# Patient Record
Sex: Female | Born: 1990 | Race: Black or African American | Hispanic: No | Marital: Single | Smoking: Current some day smoker
Health system: Southern US, Community
[De-identification: ages and names within clinical notes are randomized; demographics above are authoritative.]

## PROBLEM LIST (undated history)

## (undated) DIAGNOSIS — E7800 Pure hypercholesterolemia, unspecified: Secondary | ICD-10-CM

## (undated) DIAGNOSIS — R519 Headache, unspecified: Secondary | ICD-10-CM

## (undated) DIAGNOSIS — R87629 Unspecified abnormal cytological findings in specimens from vagina: Secondary | ICD-10-CM

## (undated) DIAGNOSIS — D649 Anemia, unspecified: Secondary | ICD-10-CM

## (undated) DIAGNOSIS — IMO0002 Reserved for concepts with insufficient information to code with codable children: Secondary | ICD-10-CM

## (undated) DIAGNOSIS — A749 Chlamydial infection, unspecified: Secondary | ICD-10-CM

## (undated) DIAGNOSIS — R0600 Dyspnea, unspecified: Secondary | ICD-10-CM

## (undated) HISTORY — PX: NO PAST SURGERIES: SHX2092

## (undated) HISTORY — DX: Unspecified abnormal cytological findings in specimens from vagina: R87.629

## (undated) HISTORY — DX: Pure hypercholesterolemia, unspecified: E78.00

## (undated) HISTORY — DX: Headache, unspecified: R51.9

## (undated) HISTORY — DX: Dyspnea, unspecified: R06.00

## (undated) HISTORY — DX: Anemia, unspecified: D64.9

---
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
## 2010-01-02 ENCOUNTER — Emergency Department: Payer: Self-pay | Admitting: Emergency Medicine

## 2010-02-09 NOTE — L&D Delivery Note (Signed)
Pt was admitted from the office today with SROM.  Fluid was clear. She was started on PCN for GBS on admission.  She was also started on Pit Aug. Once she got to 5cm she rapidly completed the first stage.  She did have an epidural.  After the epidural was completed the baby was on the perineum.  She had a rapid SVD over intact perineum in vtx presentation.  Placenta S/I, 3 vessel cord. No tears. Delivered by midwife on call. Baby to NBN.  [id number]

## 2010-05-03 ENCOUNTER — Emergency Department: Payer: Self-pay | Admitting: Unknown Physician Specialty

## 2010-05-12 LAB — HEPATITIS B SURFACE ANTIGEN: Hepatitis B Surface Ag: NEGATIVE

## 2010-05-12 LAB — HIV ANTIBODY (ROUTINE TESTING W REFLEX): HIV: NONREACTIVE

## 2010-05-12 LAB — ABO/RH: RH Type: POSITIVE

## 2010-05-12 LAB — RPR: RPR: NONREACTIVE

## 2010-11-10 ENCOUNTER — Inpatient Hospital Stay (HOSPITAL_COMMUNITY): Payer: Medicaid Other | Admitting: Anesthesiology

## 2010-11-10 ENCOUNTER — Encounter (HOSPITAL_COMMUNITY): Payer: Self-pay

## 2010-11-10 ENCOUNTER — Encounter (HOSPITAL_COMMUNITY): Payer: Self-pay | Admitting: Anesthesiology

## 2010-11-10 ENCOUNTER — Inpatient Hospital Stay (HOSPITAL_COMMUNITY)
Admission: AD | Admit: 2010-11-10 | Discharge: 2010-11-12 | DRG: 775 | Disposition: A | Discharge status: Home / Self Care | Payer: Medicaid Other | Source: Ambulatory Visit | Attending: Obstetrics and Gynecology | Admitting: Obstetrics and Gynecology

## 2010-11-10 DIAGNOSIS — O4290 Premature rupture of membranes, unspecified as to length of time between rupture and onset of labor, unspecified weeks of gestation: Principal | ICD-10-CM | POA: Diagnosis present

## 2010-11-10 DIAGNOSIS — Z22330 Carrier of Group B streptococcus: Secondary | ICD-10-CM

## 2010-11-10 DIAGNOSIS — O99892 Other specified diseases and conditions complicating childbirth: Secondary | ICD-10-CM | POA: Diagnosis present

## 2010-11-10 HISTORY — DX: Chlamydial infection, unspecified: A74.9

## 2010-11-10 HISTORY — DX: Reserved for concepts with insufficient information to code with codable children: IMO0002

## 2010-11-10 LAB — CBC
MCH: 30.9 pg (ref 26.0–34.0)
MCHC: 34.1 g/dL (ref 30.0–36.0)
Platelets: 185 10*3/uL (ref 150–400)
RBC: 3.62 MIL/uL — ABNORMAL LOW (ref 3.87–5.11)

## 2010-11-10 MED ORDER — PENICILLIN G POTASSIUM 5000000 UNITS IJ SOLR: 5.0000 10*6.[IU] | Freq: Once | INTRAMUSCULAR | Status: DC

## 2010-11-10 MED ORDER — BUTORPHANOL TARTRATE 2 MG/ML IJ SOLN
2.0000 mg | Freq: Once | INTRAMUSCULAR | Status: DC
  Filled 2010-11-10: qty 1

## 2010-11-10 MED ORDER — OXYCODONE-ACETAMINOPHEN 5-325 MG PO TABS
1.0000 | ORAL_TABLET | ORAL | Status: DC | PRN
  Administered 2010-11-11: 1 via ORAL
  Filled 2010-11-10: qty 1

## 2010-11-10 MED ORDER — LACTATED RINGERS IV SOLN: 500.0000 mL | Freq: Once | INTRAVENOUS | Status: DC

## 2010-11-10 MED ORDER — OXYTOCIN BOLUS FROM INFUSION
500.0000 mL | Freq: Once | INTRAVENOUS | Status: AC
  Administered 2010-11-10: 500 mL via INTRAVENOUS
  Filled 2010-11-10: qty 500

## 2010-11-10 MED ORDER — EPHEDRINE 5 MG/ML INJ
10.0000 mg | INTRAVENOUS | Status: DC | PRN
  Filled 2010-11-10 (×2): qty 4

## 2010-11-10 MED ORDER — PHENYLEPHRINE 40 MCG/ML (10ML) SYRINGE FOR IV PUSH (FOR BLOOD PRESSURE SUPPORT)
80.0000 ug | PREFILLED_SYRINGE | INTRAVENOUS | Status: DC | PRN
  Filled 2010-11-10: qty 5

## 2010-11-10 MED ORDER — IBUPROFEN 600 MG PO TABS
600.0000 mg | ORAL_TABLET | Freq: Four times a day (QID) | ORAL | Status: DC
  Administered 2010-11-11 – 2010-11-12 (×6): 600 mg via ORAL
  Filled 2010-11-10 (×6): qty 1

## 2010-11-10 MED ORDER — ONDANSETRON HCL 4 MG/2ML IJ SOLN: 4.0000 mg | Freq: Four times a day (QID) | INTRAMUSCULAR | Status: DC | PRN

## 2010-11-10 MED ORDER — ONDANSETRON HCL 4 MG/2ML IJ SOLN: 4.0000 mg | INTRAMUSCULAR | Status: DC | PRN

## 2010-11-10 MED ORDER — LACTATED RINGERS IV SOLN
INTRAVENOUS | Status: DC
  Administered 2010-11-10: 12:00:00 via INTRAVENOUS

## 2010-11-10 MED ORDER — TETANUS-DIPHTH-ACELL PERTUSSIS 5-2.5-18.5 LF-MCG/0.5 IM SUSP
0.5000 mL | Freq: Once | INTRAMUSCULAR | Status: AC
  Administered 2010-11-11: 0.5 mL via INTRAMUSCULAR
  Filled 2010-11-10: qty 0.5

## 2010-11-10 MED ORDER — LACTATED RINGERS IV SOLN
500.0000 mL | INTRAVENOUS | Status: DC | PRN
Start: 2010-11-10 — End: 2010-11-10
  Administered 2010-11-10: 500 mL via INTRAVENOUS

## 2010-11-10 MED ORDER — DIPHENHYDRAMINE HCL 50 MG/ML IJ SOLN: 12.5000 mg | INTRAMUSCULAR | Status: DC | PRN

## 2010-11-10 MED ORDER — SIMETHICONE 80 MG PO CHEW: 80.0000 mg | CHEWABLE_TABLET | ORAL | Status: DC | PRN

## 2010-11-10 MED ORDER — OXYTOCIN 20 UNITS IN LACTATED RINGERS INFUSION - SIMPLE: 125.0000 mL/h | Freq: Once | INTRAVENOUS | Status: DC

## 2010-11-10 MED ORDER — FENTANYL 2.5 MCG/ML BUPIVACAINE 1/10 % EPIDURAL INFUSION (WH - ANES)
14.0000 mL/h | INTRAMUSCULAR | Status: DC
  Administered 2010-11-10: 14 mL/h via EPIDURAL
  Filled 2010-11-10: qty 60

## 2010-11-10 MED ORDER — ZOLPIDEM TARTRATE 5 MG PO TABS: 5.0000 mg | ORAL_TABLET | Freq: Every evening | ORAL | Status: DC | PRN

## 2010-11-10 MED ORDER — WITCH HAZEL-GLYCERIN EX PADS: 1.0000 "application " | MEDICATED_PAD | CUTANEOUS | Status: DC | PRN

## 2010-11-10 MED ORDER — OXYCODONE-ACETAMINOPHEN 5-325 MG PO TABS: 2.0000 | ORAL_TABLET | ORAL | Status: DC | PRN

## 2010-11-10 MED ORDER — LIDOCAINE HCL (PF) 1 % IJ SOLN
30.0000 mL | INTRAMUSCULAR | Status: DC | PRN
  Filled 2010-11-10 (×2): qty 30

## 2010-11-10 MED ORDER — IBUPROFEN 600 MG PO TABS
600.0000 mg | ORAL_TABLET | Freq: Four times a day (QID) | ORAL | Status: DC | PRN
  Administered 2010-11-10: 600 mg via ORAL
  Filled 2010-11-10: qty 1

## 2010-11-10 MED ORDER — MEASLES, MUMPS & RUBELLA VAC ~~LOC~~ INJ
0.5000 mL | INJECTION | Freq: Once | SUBCUTANEOUS | Status: DC
  Filled 2010-11-10: qty 0.5

## 2010-11-10 MED ORDER — PHENYLEPHRINE 40 MCG/ML (10ML) SYRINGE FOR IV PUSH (FOR BLOOD PRESSURE SUPPORT)
80.0000 ug | PREFILLED_SYRINGE | INTRAVENOUS | Status: DC | PRN
  Filled 2010-11-10 (×2): qty 5

## 2010-11-10 MED ORDER — FLEET ENEMA 7-19 GM/118ML RE ENEM: 1.0000 | ENEMA | RECTAL | Status: DC | PRN

## 2010-11-10 MED ORDER — OXYTOCIN 20 UNITS IN LACTATED RINGERS INFUSION - SIMPLE
1.0000 m[IU]/min | INTRAVENOUS | Status: DC
  Administered 2010-11-10: 2 m[IU]/min via INTRAVENOUS
  Filled 2010-11-10: qty 1000

## 2010-11-10 MED ORDER — PENICILLIN G POTASSIUM 5000000 UNITS IJ SOLR: 2.5000 10*6.[IU] | INTRAVENOUS | Status: DC

## 2010-11-10 MED ORDER — PENICILLIN G POTASSIUM 5000000 UNITS IJ SOLR
2.5000 10*6.[IU] | INTRAVENOUS | Status: DC
  Administered 2010-11-10: 2.5 10*6.[IU] via INTRAVENOUS
  Filled 2010-11-10 (×4): qty 2.5

## 2010-11-10 MED ORDER — CITRIC ACID-SODIUM CITRATE 334-500 MG/5ML PO SOLN: 30.0000 mL | ORAL | Status: DC | PRN

## 2010-11-10 MED ORDER — ONDANSETRON HCL 4 MG PO TABS: 4.0000 mg | ORAL_TABLET | ORAL | Status: DC | PRN

## 2010-11-10 MED ORDER — BENZOCAINE-MENTHOL 20-0.5 % EX AERO: 1.0000 "application " | INHALATION_SPRAY | CUTANEOUS | Status: DC | PRN

## 2010-11-10 MED ORDER — ACETAMINOPHEN 325 MG PO TABS: 650.0000 mg | ORAL_TABLET | ORAL | Status: DC | PRN

## 2010-11-10 MED ORDER — EPHEDRINE 5 MG/ML INJ
10.0000 mg | INTRAVENOUS | Status: DC | PRN
  Filled 2010-11-10: qty 4

## 2010-11-10 MED ORDER — PENICILLIN G POTASSIUM 5000000 UNITS IJ SOLR
5.0000 10*6.[IU] | Freq: Once | INTRAMUSCULAR | Status: AC
  Administered 2010-11-10: 5 10*6.[IU] via INTRAVENOUS
  Filled 2010-11-10: qty 5

## 2010-11-10 MED ORDER — LIDOCAINE HCL 1.5 % IJ SOLN
INTRAMUSCULAR | Status: DC | PRN
  Administered 2010-11-10 (×2): 5 mL via INTRADERMAL

## 2010-11-10 MED ORDER — TERBUTALINE SULFATE 1 MG/ML IJ SOLN: 0.2500 mg | Freq: Once | INTRAMUSCULAR | Status: DC | PRN

## 2010-11-10 MED ORDER — DIBUCAINE 1 % RE OINT: 1.0000 "application " | TOPICAL_OINTMENT | RECTAL | Status: DC | PRN

## 2010-11-10 NOTE — H&P (Signed)
Pt is a 19 y/o female, G2P0 who was seen in the office today c/o ROM.  In the office she had positive pool.  She was admitted for induction.  PNC was c/o by + GBS. PMHx: see [facility] PE: VSSAF  HEENT- wnl  ABD: -  Gravid, nontender, no ctxs  FHTs- reactive. IMP/ IUP with PROM. PLAN/ Admit.

## 2010-11-10 NOTE — Progress Notes (Signed)
[facility] called for delivery. Unable to reach provider at this time.

## 2010-11-10 NOTE — Progress Notes (Signed)
[facility] called for delivery. Provider states that he is 10 minutes away.

## 2010-11-10 NOTE — Anesthesia Preprocedure Evaluation (Signed)
Anesthesia Evaluation  Name, MR# and DOB Patient awake  General Assessment Comment  Reviewed: Allergy & Precautions, H&P , Patient's Chart, lab work & pertinent test results  Airway Mallampati: III TM Distance: >3 FB Neck ROM: full    Dental No notable dental hx.    Pulmonary asthma  clear to auscultation  Pulmonary exam normal       Cardiovascular regular Normal    Neuro/Psych Negative Neurological ROS  Negative Psych ROS   GI/Hepatic negative GI ROS Neg liver ROS    Endo/Other  Negative Endocrine ROS  Renal/GU negative Renal ROS     Musculoskeletal   Abdominal   Peds  Hematology negative hematology ROS (+)   Anesthesia Other Findings   Reproductive/Obstetrics (+) Pregnancy                           Anesthesia Physical Anesthesia Plan  ASA: II  Anesthesia Plan: Epidural   Post-op Pain Management:    Induction:   Airway Management Planned:   Additional Equipment:   Intra-op Plan:   Post-operative Plan:   Informed Consent: I have reviewed the patients History and Physical, chart, labs and discussed the procedure including the risks, benefits and alternatives for the proposed anesthesia with the patient or authorized representative who has indicated his/her understanding and acceptance.     Plan Discussed with:   Anesthesia Plan Comments:         Anesthesia Quick Evaluation

## 2010-11-10 NOTE — Anesthesia Procedure Notes (Signed)
Epidural Patient location during procedure: OB Start time: 11/10/2010 8:36 PM  Staffing Performed by: anesthesiologist   Preanesthetic Checklist Completed: patient identified, site marked, surgical consent, pre-op evaluation, timeout performed, IV checked, risks and benefits discussed and monitors and equipment checked  Epidural Patient position: sitting Prep: site prepped and draped and DuraPrep Patient monitoring: continuous pulse ox and blood pressure Approach: midline Injection technique: LOR air and LOR saline  Needle:  Needle type: Tuohy  Needle gauge: 17 G Needle length: 9 cm Needle insertion depth: 6 cm Catheter type: closed end flexible Catheter size: 19 Gauge Catheter at skin depth: 11 cm Test dose: negative  Assessment Events: blood not aspirated, injection not painful, no injection resistance, negative IV test and no paresthesia  Additional Notes Patient identified.  Risk benefits discussed including failed block, incomplete pain control, headache, nerve damage, paralysis, blood pressure changes, nausea, vomiting, reactions to medication both toxic or allergic, and postpartum back pain.  Patient expressed understanding and wished to proceed.  All questions were answered.  Sterile technique used throughout procedure and epidural site dressed with sterile barrier dressing. No paresthesia or other complications noted.The patient did not experience any signs of intravascular injection such as tinnitus or metallic taste in mouth nor signs of intrathecal spread such as rapid motor block. Please see nursing notes for vital signs.

## 2010-11-10 NOTE — Progress Notes (Signed)
Shores, CNM, called for backup for delivery.

## 2010-11-10 NOTE — Progress Notes (Signed)
Updated on patient status. Patient requesting IV pain medication. Orders received for 2 mg of Stadol IV

## 2010-11-11 LAB — CBC
HCT: 33.1 % — ABNORMAL LOW (ref 36.0–46.0)
Hemoglobin: 11.1 g/dL — ABNORMAL LOW (ref 12.0–15.0)
MCHC: 33.5 g/dL (ref 30.0–36.0)
WBC: 11.4 10*3/uL — ABNORMAL HIGH (ref 4.0–10.5)

## 2010-11-11 LAB — RPR: RPR Ser Ql: NONREACTIVE

## 2010-11-11 MED ORDER — BENZOCAINE-MENTHOL 20-0.5 % EX AERO
INHALATION_SPRAY | CUTANEOUS | Status: AC
  Filled 2010-11-11: qty 56

## 2010-11-11 NOTE — Progress Notes (Signed)
Post Partum Day 1 Subjective: no complaints  Objective: Blood pressure 126/71, pulse 100, temperature 98 F (36.7 C), temperature source Oral, resp. rate 18, height 5\' 5"  (1.651 m), weight 95.709 kg (211 lb), last menstrual period 02/19/2010, SpO2 98.00%, unknown if currently breastfeeding.  Physical Exam:  General: alert Lochia: appropriate Uterine Fundus: firm   Basename 11/11/10 0525 11/10/10 1300  HGB 11.1* 11.2*  HCT 33.1* 32.8*    Assessment/Plan: Plan for discharge tomorrow, wants baby circumcised but has not yet paid.  Discussed option to perform circ in office.   LOS: 1 day   Donevin Sainsbury D 11/11/2010, 8:09 AM

## 2010-11-11 NOTE — Progress Notes (Signed)
UR chart review completed.  

## 2010-11-12 MED ORDER — INFLUENZA VIRUS VACC SPLIT PF IM SUSP
0.5000 mL | INTRAMUSCULAR | Status: AC | PRN
  Administered 2010-11-12: 0.5 mL via INTRAMUSCULAR
  Filled 2010-11-12: qty 0.5

## 2010-11-12 NOTE — Anesthesia Postprocedure Evaluation (Signed)
  Anesthesia Post-op Note  Patient: Jasmine Bowman  Procedure(s) Performed: * No procedures listed *  Patient Location: Mother/Baby  Anesthesia Type: Epidural  Level of Consciousness: awake, alert , oriented and patient cooperative  Airway and Oxygen Therapy: Patient Spontanous Breathing  Post-op Pain: none  Post-op Assessment: Patient's Cardiovascular Status Stable, Respiratory Function Stable, Patent Airway, No signs of Nausea or vomiting, Adequate PO intake and Pain level controlled  Post-op Vital Signs: Reviewed and stable  Complications: No apparent anesthesia complications

## 2010-11-12 NOTE — Anesthesia Postprocedure Evaluation (Signed)
Anesthesia Post Note  Patient: Jasmine Bowman  Procedure(s) Performed: * No procedures listed *  Anesthesia type: Epidural  Patient location: Mother/Baby  Post pain: Pain level controlled  Post assessment: Post-op Vital signs reviewed  Last Vitals:  Filed Vitals:   11/11/10 2140  BP: 134/65  Pulse: 98  Temp: 97.7 F (36.5 C)  Resp: 18    Post vital signs: Reviewed  Level of consciousness: awake  Complications: No apparent anesthesia complications

## 2010-11-12 NOTE — Addendum Note (Signed)
Addendum  created 11/12/10 1047 by Suella Grove   Modules edited:Charges VN, Notes Section

## 2010-11-12 NOTE — Progress Notes (Signed)
Patient is eating, ambulating, voiding.  Pain control is good.  Filed Vitals:   11/11/10 0430 11/11/10 1455 11/11/10 2140 11/12/10 0629  BP: 126/71 118/71 134/65 114/74  Pulse: 100 97 98 92  Temp: 98 F (36.7 C) 98.1 F (36.7 C) 97.7 F (36.5 C) 98 F (36.7 C)  TempSrc: Oral Oral Oral Oral  Resp: 18 18 18 18   Height:      Weight:      SpO2:        Fundus firm Perineum without swelling.  Lab Results  Component Value Date   WBC 11.4* 11/11/2010   HGB 11.1* 11/11/2010   HCT 33.1* 11/11/2010   MCV 90.7 11/11/2010   PLT 189 11/11/2010    A/Positive/-- (04/02 0000)/RI  A/P Post partum day 2.  Routine care.  Expect d/c per plan.    Yosselin Zoeller A

## 2010-11-12 NOTE — Discharge Summary (Signed)
Obstetric Discharge Summary Reason for Admission: SROM, induction Prenatal Procedures: none Intrapartum Procedures: spontaneous vaginal delivery Postpartum Procedures: none Complications-Operative and Postpartum: none Hemoglobin  Date Value Range Status  11/11/2010 11.1* 12.0-15.0 (g/dL) Final     HCT  Date Value Range Status  11/11/2010 33.1* 36.0-46.0 (%) Final    Discharge Diagnoses: Term Pregnancy-delivered  Discharge Information: Date: 11/12/2010 Activity: pelvic rest Diet: routine Medications: Ibuprofen Condition: stable Instructions: refer to practice specific booklet Discharge to: home Follow-up Information    Make an appointment with Mickel Baas.   Contact information:   [address] [place] [address] [phone]          Newborn Data: Live born female  Birth Weight: 6 lb 2.2 oz (2785 g) APGAR: 9, 9  Home with mother.  Griselle Rufer A 11/12/2010, 7:12 AM

## 2012-06-06 IMAGING — US US OB < 14 WEEKS
1 series · 17 of 28 positions shown · non-contrast
Comparison: none

REASON FOR EXAM: vaginal bleeding    Flex 6
COMMENTS:

[Series 1: us ob < 14 weeks · 17 of 35 slices shown]
[im 1/35]
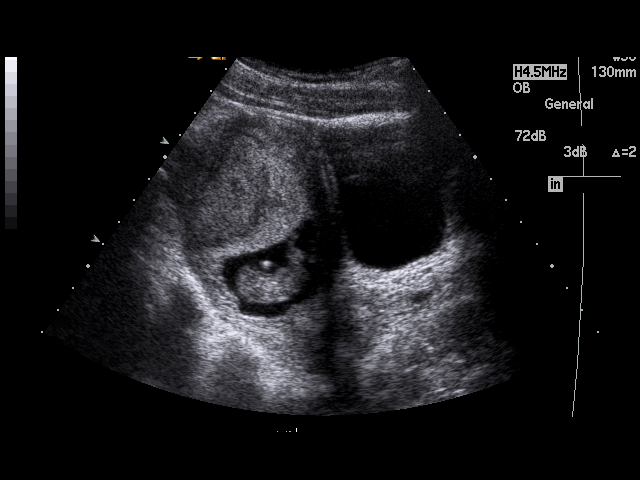
[im 3/35]
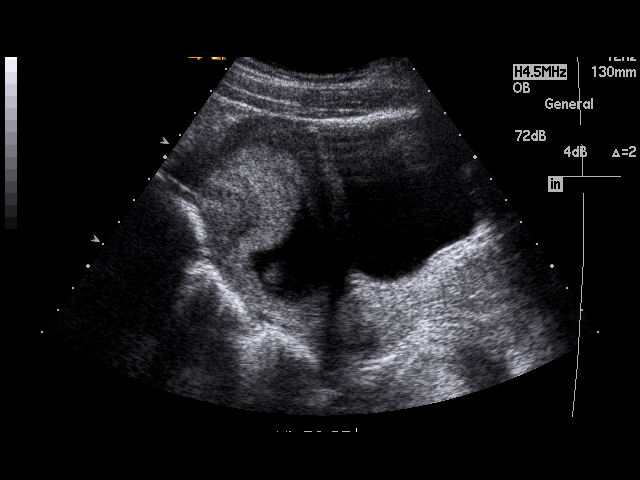
[im 6/35]
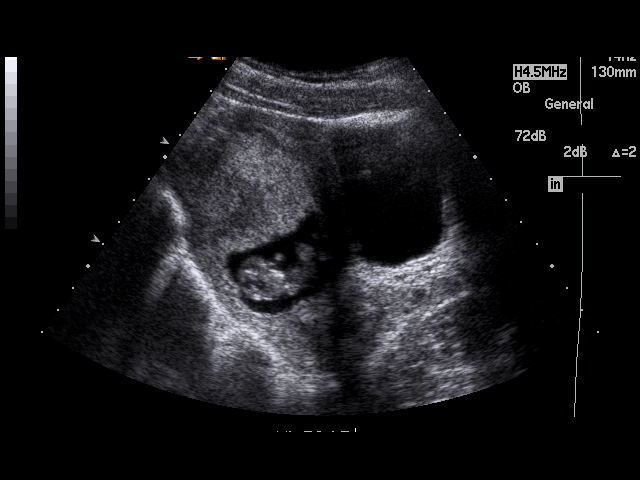
[im 7/35]
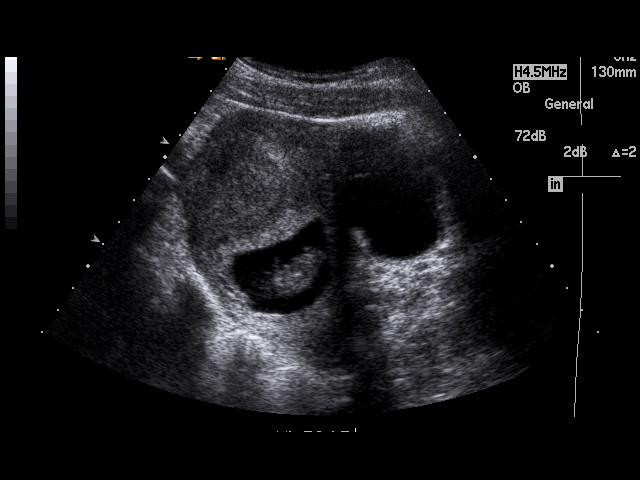
[im 9/35]
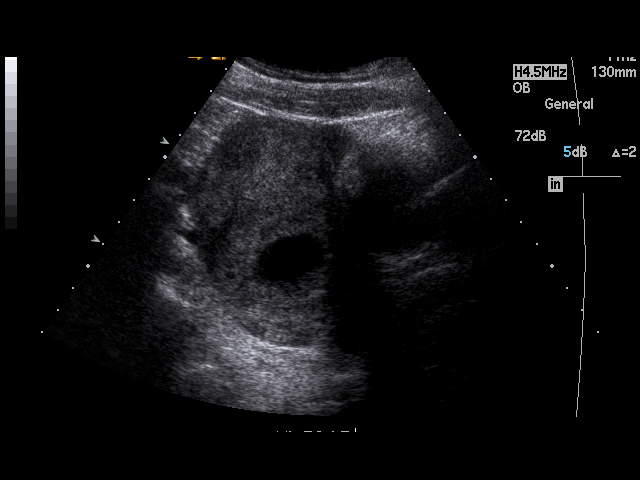
[im 12/35]
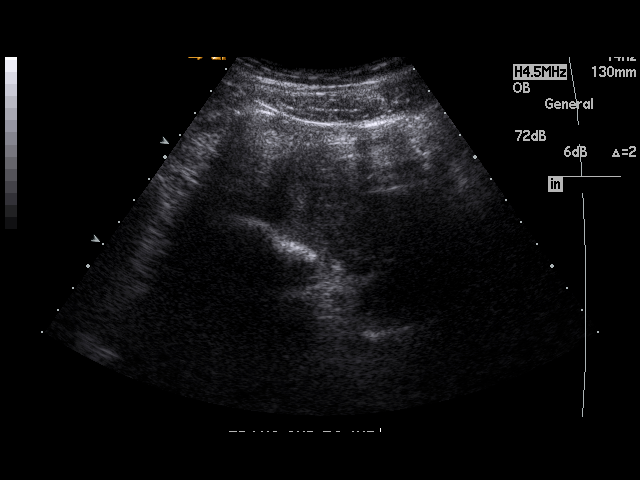
[im 13/35]
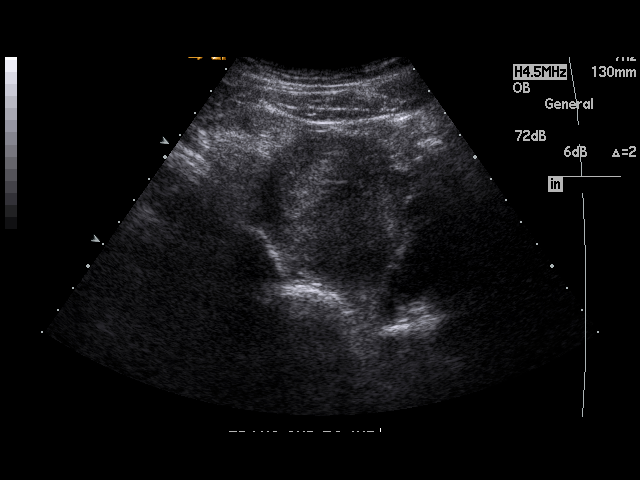
[im 16/35]
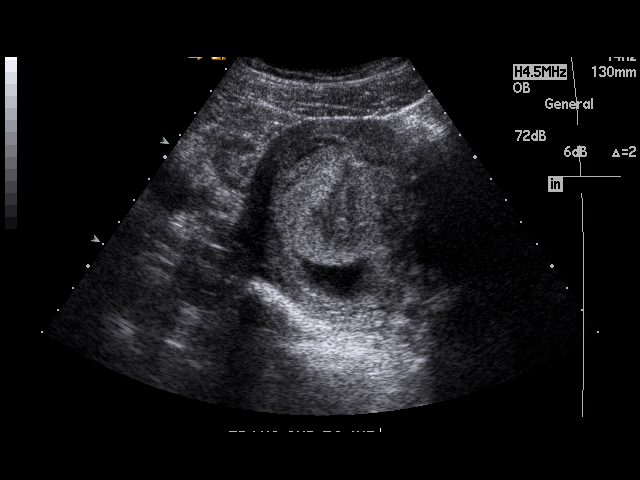
[im 18/35]
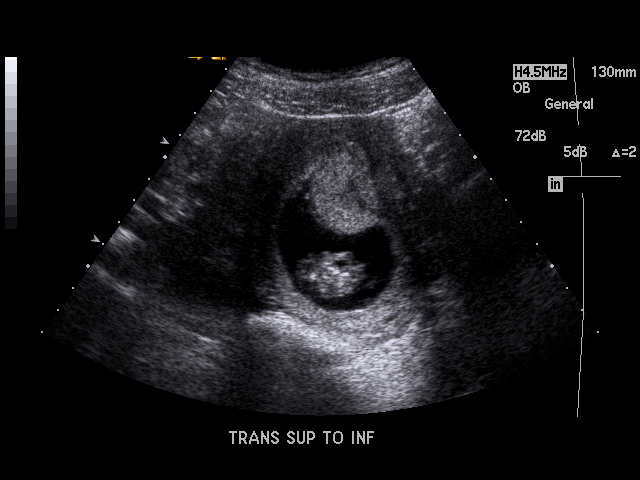
[im 19/35]
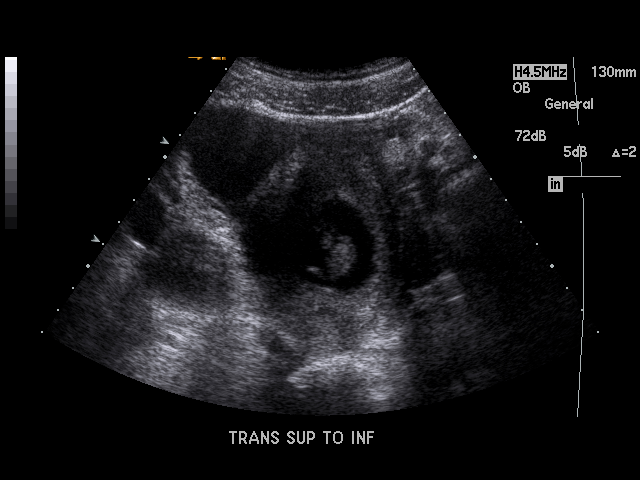
[im 22/35]
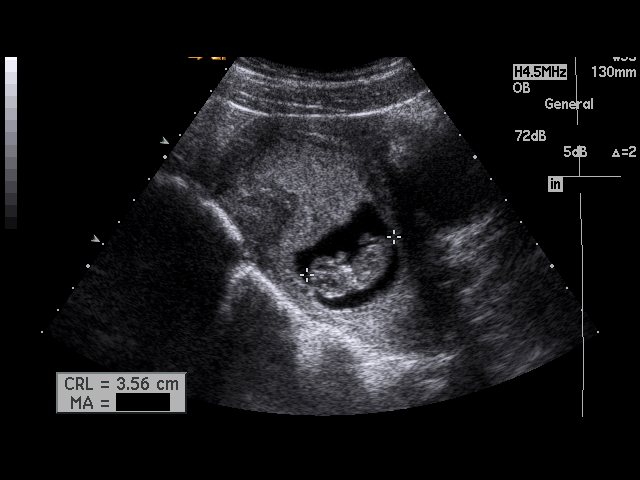
[im 23/35]
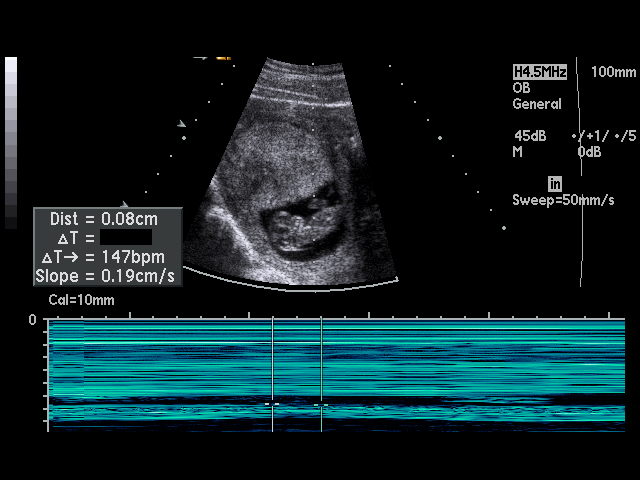
[im 26/35]
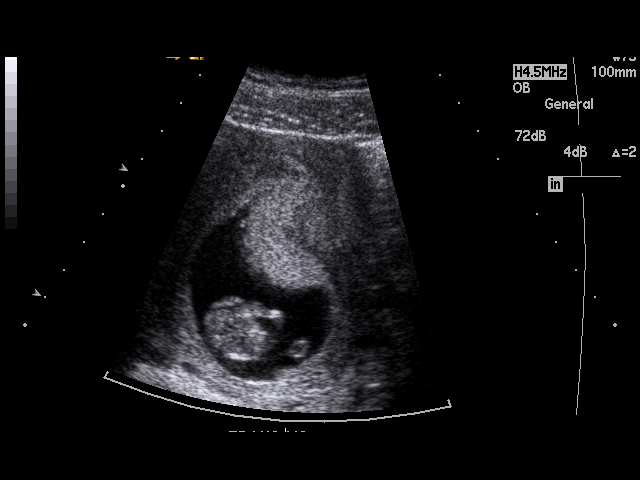
[im 28/35]
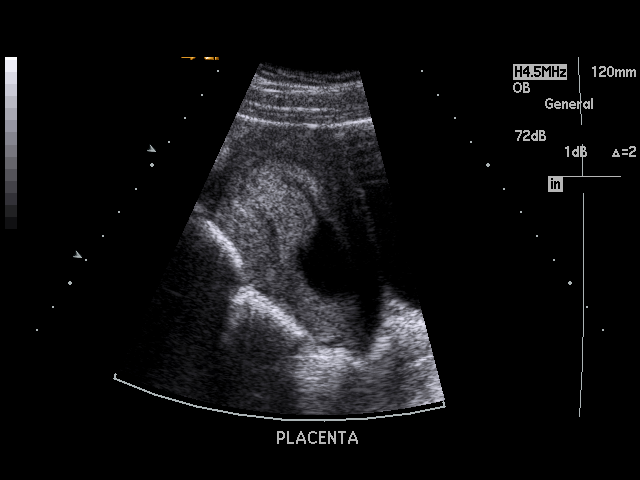
[im 29/35]
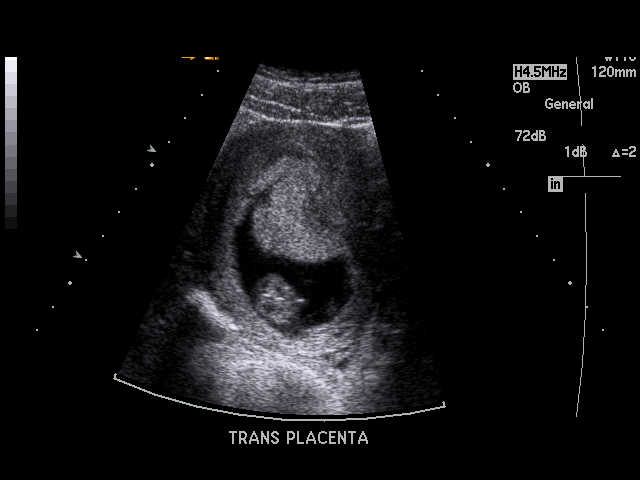
[im 32/35]
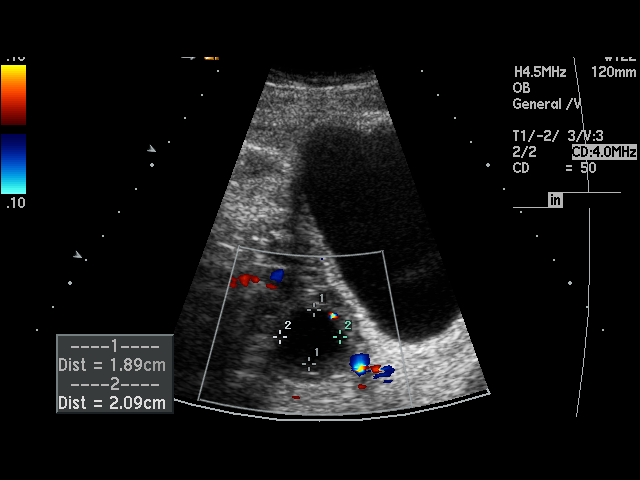
[im 35/35]
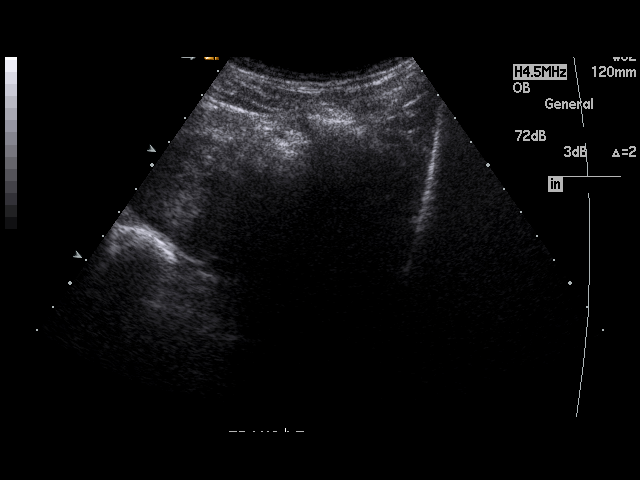

[17 of 28 positions shown; findings below may reference images not displayed]

PROCEDURE:     US  - US OB LESS THAN 14 WEEKS  - May 03, 2010 [DATE]

RESULT:     Comparison: 01/03/2010

Technique and Findings:
Multiple grayscale and color Doppler images were obtained of the pelvis by
transabdominal ultrasound.

There is a fluid collection within the endometrial canal, consistent with a
gestational sac. There is a single live intrauterine pregnancy with a
gestational sac. The crown-rump length correlates with estimated gestational
age of 10 weeks 3 days. A yolk sac is present. The fetal heart rate was
measured at 150 beats per minute.

The right ovary measures 3.6 x 2.9 x 3.4 cm. There is an anechoic cyst in
the right ovary which measures 2.3 cm in greatest diameter. The left ovary
was not visualized.
IMPRESSION: Single live intrauterine pregnancy, with estimated gestational age of 10
weeks 3 days.

## 2013-12-11 ENCOUNTER — Encounter (HOSPITAL_COMMUNITY): Payer: Self-pay

## 2014-10-18 ENCOUNTER — Emergency Department (HOSPITAL_COMMUNITY)
Admission: EM | Admit: 2014-10-18 | Discharge: 2014-10-18 | Disposition: A | Discharge status: Home / Self Care | Payer: No Typology Code available for payment source | Attending: Emergency Medicine | Admitting: Emergency Medicine

## 2014-10-18 ENCOUNTER — Encounter (HOSPITAL_COMMUNITY): Payer: Self-pay | Admitting: *Deleted

## 2014-10-18 ENCOUNTER — Emergency Department (HOSPITAL_COMMUNITY): Payer: No Typology Code available for payment source

## 2014-10-18 DIAGNOSIS — S233XXA Sprain of ligaments of thoracic spine, initial encounter: Secondary | ICD-10-CM | POA: Insufficient documentation

## 2014-10-18 DIAGNOSIS — Z8619 Personal history of other infectious and parasitic diseases: Secondary | ICD-10-CM | POA: Diagnosis not present

## 2014-10-18 DIAGNOSIS — S29012A Strain of muscle and tendon of back wall of thorax, initial encounter: Secondary | ICD-10-CM | POA: Insufficient documentation

## 2014-10-18 DIAGNOSIS — Y92410 Unspecified street and highway as the place of occurrence of the external cause: Secondary | ICD-10-CM | POA: Diagnosis not present

## 2014-10-18 DIAGNOSIS — J45909 Unspecified asthma, uncomplicated: Secondary | ICD-10-CM | POA: Diagnosis not present

## 2014-10-18 DIAGNOSIS — Y9389 Activity, other specified: Secondary | ICD-10-CM | POA: Insufficient documentation

## 2014-10-18 DIAGNOSIS — S161XXA Strain of muscle, fascia and tendon at neck level, initial encounter: Secondary | ICD-10-CM | POA: Diagnosis not present

## 2014-10-18 DIAGNOSIS — Y998 Other external cause status: Secondary | ICD-10-CM | POA: Diagnosis not present

## 2014-10-18 DIAGNOSIS — S199XXA Unspecified injury of neck, initial encounter: Secondary | ICD-10-CM | POA: Diagnosis present

## 2014-10-18 MED ORDER — CYCLOBENZAPRINE HCL 10 MG PO TABS
5.0000 mg | ORAL_TABLET | Freq: Once | ORAL | Status: AC
  Administered 2014-10-18: 5 mg via ORAL
  Filled 2014-10-18: qty 1

## 2014-10-18 MED ORDER — CYCLOBENZAPRINE HCL 5 MG PO TABS: 5.0000 mg | ORAL_TABLET | Freq: Three times a day (TID) | ORAL | Status: DC

## 2014-10-18 MED ORDER — IBUPROFEN 600 MG PO TABS: 600.0000 mg | ORAL_TABLET | Freq: Once | ORAL | Status: DC

## 2014-10-18 MED ORDER — IBUPROFEN 200 MG PO TABS
600.0000 mg | ORAL_TABLET | Freq: Once | ORAL | Status: AC
  Administered 2014-10-18: 600 mg via ORAL
  Filled 2014-10-18: qty 3

## 2014-10-18 NOTE — Discharge Instructions (Signed)
Our x-rays are normal.  U been given an anti-inflammatory as well as a muscle relaxer.  Please take these on a regular basis.  He been given some exercises that you can do for your neck that will help as well
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
## 2014-10-30 ENCOUNTER — Emergency Department (HOSPITAL_COMMUNITY)
Admission: EM | Admit: 2014-10-30 | Discharge: 2014-10-30 | Disposition: A | Discharge status: Home / Self Care | Payer: No Typology Code available for payment source | Attending: Emergency Medicine | Admitting: Emergency Medicine

## 2014-10-30 ENCOUNTER — Encounter (HOSPITAL_COMMUNITY): Payer: Self-pay | Admitting: Emergency Medicine

## 2014-10-30 DIAGNOSIS — R51 Headache: Secondary | ICD-10-CM | POA: Diagnosis present

## 2014-10-30 DIAGNOSIS — G44209 Tension-type headache, unspecified, not intractable: Secondary | ICD-10-CM | POA: Diagnosis not present

## 2014-10-30 DIAGNOSIS — J45909 Unspecified asthma, uncomplicated: Secondary | ICD-10-CM | POA: Insufficient documentation

## 2014-10-30 DIAGNOSIS — Z8619 Personal history of other infectious and parasitic diseases: Secondary | ICD-10-CM | POA: Insufficient documentation

## 2014-10-30 DIAGNOSIS — Z79899 Other long term (current) drug therapy: Secondary | ICD-10-CM | POA: Diagnosis not present

## 2014-10-30 DIAGNOSIS — M542 Cervicalgia: Secondary | ICD-10-CM | POA: Diagnosis not present

## 2014-10-30 DIAGNOSIS — M545 Low back pain: Secondary | ICD-10-CM | POA: Diagnosis not present

## 2014-10-30 DIAGNOSIS — Z87828 Personal history of other (healed) physical injury and trauma: Secondary | ICD-10-CM | POA: Insufficient documentation

## 2014-10-30 NOTE — Discharge Instructions (Signed)
Tension Headache °A tension headache is pain, pressure, or aching felt over the front and sides of the head. Tension headaches often come after stress, feeling worried (anxiety), or feeling sad or down for a while (depressed). °HOME CARE °· Only take medicine as told by your doctor. °· Lie down in a dark, quiet room when you have a headache. °· Keep a journal to find out if certain things bring on headaches. For example, write down: °¨ What you eat and drink. °¨ How much sleep you get. °¨ Any change to your diet or medicines. °· Relax by getting a massage or doing other relaxing activities. °· Put ice or heat packs on the head and neck area as told by your doctor. °· Lessen stress. °· Sit up straight. Do not tighten (tense) your muscles. °· Quit smoking if you smoke. °· Lessen how much alcohol you drink. °· Lessen how much caffeine you drink, or stop drinking caffeine. °· Eat and exercise regularly. °· Get enough sleep. °· Avoid using too much pain medicine. °GET HELP RIGHT AWAY IF:  °· Your headache becomes really bad. °· You have a fever. °· You have a stiff neck. °· You have trouble seeing. °· Your muscles are weak, or you lose muscle control. °· You lose your balance or have trouble walking. °· You feel like you will pass out (faint), or you pass out. °· You have really bad symptoms that are different than your first symptoms. °· You have problems with the medicines given to you by your doctor. °· Your medicines do not work. °· Your headache feels different than the other headaches. °· You feel sick to your stomach (nauseous) or throw up (vomit). °MAKE SURE YOU:  °· Understand these instructions. °· Will watch your condition. °· Will get help right away if you are not doing well or get worse. °Document Released: 04/22/2009 Document Revised: 04/20/2011 Document Reviewed: 01/16/2011 °[employer]® Patient Information ©2015 [employer]. This information is not intended to replace advice given to you by your health  care provider. Make sure you discuss any questions you have with your health care provider. ° °
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
## 2014-10-30 NOTE — ED Notes (Signed)
NP at bedside.
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
## 2014-11-15 ENCOUNTER — Ambulatory Visit (INDEPENDENT_AMBULATORY_CARE_PROVIDER_SITE_OTHER): Payer: Commercial Managed Care - HMO | Admitting: Neurology

## 2014-11-15 ENCOUNTER — Encounter: Payer: Self-pay | Admitting: Neurology

## 2014-11-15 VITALS — BP 106/73 | HR 78 | Ht 65.5 in | Wt 221.0 lb

## 2014-11-15 DIAGNOSIS — F0781 Postconcussional syndrome: Secondary | ICD-10-CM

## 2014-11-15 DIAGNOSIS — S0990XA Unspecified injury of head, initial encounter: Secondary | ICD-10-CM | POA: Diagnosis not present

## 2014-11-15 MED ORDER — NORTRIPTYLINE HCL 10 MG PO CAPS: 10.0000 mg | ORAL_CAPSULE | Freq: Every day | ORAL | Status: AC

## 2014-11-15 MED ORDER — CYCLOBENZAPRINE HCL 5 MG PO TABS: 5.0000 mg | ORAL_TABLET | Freq: Three times a day (TID) | ORAL | Status: AC | PRN

## 2014-11-15 NOTE — Patient Instructions (Signed)
Overall you are doing fairly well but I do want to suggest a few things today:   Remember to drink plenty of fluid, eat healthy meals and do not skip any meals. Try to eat protein with a every meal and eat a healthy snack such as fruit or nuts in between meals. Try to keep a regular sleep-wake schedule and try to exercise daily, particularly in the form of walking, 20-30 minutes a day, if you can.   As far as your medications are concerned, I would like to suggest: Nortriptyline at night an hour before bed Flexeril as needed up to 3x a day, don't take with soma may cause sedation  As far as diagnostic testing: CT of the head, labs  I would like to see you back as needed, sooner if we need to. Please call us with any interim questions, concerns, problems, updates or refill requests.   Our phone number is [phone]. We also have an after hours call service for urgent matters and there is a physician on-call for urgent questions. For any emergencies you know to call 911 or go to the nearest emergency room
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
## 2014-11-16 ENCOUNTER — Telehealth: Payer: Self-pay | Admitting: *Deleted

## 2014-11-16 LAB — COMPREHENSIVE METABOLIC PANEL
ALT: 29 IU/L (ref 0–32)
AST: 24 IU/L (ref 0–40)
Albumin/Globulin Ratio: 1.5 (ref 1.1–2.5)
Albumin: 4.3 g/dL (ref 3.5–5.5)
Alkaline Phosphatase: 58 IU/L (ref 39–117)
BUN/Creatinine Ratio: 15 (ref 8–20)
BUN: 12 mg/dL (ref 6–20)
Bilirubin Total: 0.2 mg/dL (ref 0.0–1.2)
CALCIUM: 9.5 mg/dL (ref 8.7–10.2)
CO2: 24 mmol/L (ref 18–29)
CREATININE: 0.8 mg/dL (ref 0.57–1.00)
Chloride: 99 mmol/L (ref 97–108)
GFR, EST AFRICAN AMERICAN: 119 mL/min/{1.73_m2} (ref >59)
GFR, EST NON AFRICAN AMERICAN: 103 mL/min/{1.73_m2} (ref >59)
GLOBULIN, TOTAL: 2.9 g/dL (ref 1.5–4.5)
Glucose: 91 mg/dL (ref 65–99)
Potassium: 4.3 mmol/L (ref 3.5–5.2)
SODIUM: 138 mmol/L (ref 134–144)
TOTAL PROTEIN: 7.2 g/dL (ref 6.0–8.5)

## 2014-11-16 LAB — CBC
HEMATOCRIT: 37.2 % (ref 34.0–46.6)
HEMOGLOBIN: 12.3 g/dL (ref 11.1–15.9)
MCH: 28.5 pg (ref 26.6–33.0)
MCHC: 33.1 g/dL (ref 31.5–35.7)
MCV: 86 fL (ref 79–97)
Platelets: 269 10*3/uL (ref 150–379)
RBC: 4.31 x10E6/uL (ref 3.77–5.28)
RDW: 13.5 % (ref 12.3–15.4)
WBC: 4.8 10*3/uL (ref 3.4–10.8)

## 2014-11-16 NOTE — Telephone Encounter (Signed)
-----   Message from Anson Fret, MD sent at 11/16/2014  9:51 AM EDT ----- Let patient know labs normal

## 2014-11-16 NOTE — Telephone Encounter (Signed)
LVM for pt to call back about lab results. Gave [facility] phone number and hours. Okay to inform her labs were normal.

## 2014-11-22 NOTE — Telephone Encounter (Signed)
LVM for pt to call back about lab results. Gave [facility] phone number and office hours. Okay to inform her labs are normal.

## 2014-11-22 NOTE — Telephone Encounter (Signed)
I advised the patient her lab results were normal.

## 2014-11-23 ENCOUNTER — Ambulatory Visit
Admission: RE | Admit: 2014-11-23 | Discharge: 2014-11-23 | Disposition: A | Discharge status: Home / Self Care | Payer: Medicaid Other | Source: Ambulatory Visit | Attending: Neurology | Admitting: Neurology

## 2014-11-23 DIAGNOSIS — F0781 Postconcussional syndrome: Secondary | ICD-10-CM

## 2014-11-23 DIAGNOSIS — S0990XA Unspecified injury of head, initial encounter: Secondary | ICD-10-CM

## 2014-11-26 ENCOUNTER — Telehealth: Payer: Self-pay | Admitting: *Deleted

## 2014-11-26 NOTE — Telephone Encounter (Signed)
-----   Message from Anson FretAntonia B Ahern, MD sent at 11/25/2014  9:25 PM EDT ----- Let patient know the CT of her head is normal thanks

## 2014-11-26 NOTE — Telephone Encounter (Signed)
LVM for pt to call about results. Gave [facility] phone number and office hours. Okay to let her know CT head was normal.

## 2014-11-29 NOTE — Telephone Encounter (Signed)
Spoke to pt and gave her normal CT head results.   She verbalized understanding.
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
## 2017-03-29 MED FILL — BENZONATATE 100 MG CAP: 100 | 10 days supply | Qty: 30 | Fill #0

## 2017-03-29 MED FILL — AMOXICILLIN 500 MG CAPSULE: 500 | 10 days supply | Qty: 30 | Fill #0

## 2017-04-06 MED FILL — FLUCONAZOLE 150 MG TABLET: 150 | 2 days supply | Qty: 2 | Fill #0

## 2018-12-21 ENCOUNTER — Other Ambulatory Visit: Payer: Self-pay

## 2018-12-21 ENCOUNTER — Ambulatory Visit (INDEPENDENT_AMBULATORY_CARE_PROVIDER_SITE_OTHER): Payer: Medicaid Other | Admitting: Certified Nurse Midwife

## 2018-12-21 ENCOUNTER — Other Ambulatory Visit (HOSPITAL_COMMUNITY)
Admission: RE | Admit: 2018-12-21 | Discharge: 2018-12-21 | Disposition: A | Discharge status: Home / Self Care | Payer: Medicaid Other | Source: Ambulatory Visit | Attending: Certified Nurse Midwife | Admitting: Certified Nurse Midwife

## 2018-12-21 ENCOUNTER — Encounter: Payer: Self-pay | Admitting: Certified Nurse Midwife

## 2018-12-21 VITALS — BP 120/80 | HR 77 | Ht 66.0 in | Wt 198.3 lb

## 2018-12-21 DIAGNOSIS — Z01419 Encounter for gynecological examination (general) (routine) without abnormal findings: Secondary | ICD-10-CM | POA: Diagnosis not present

## 2018-12-21 DIAGNOSIS — Z0000 Encounter for general adult medical examination without abnormal findings: Secondary | ICD-10-CM

## 2018-12-21 DIAGNOSIS — Z113 Encounter for screening for infections with a predominantly sexual mode of transmission: Secondary | ICD-10-CM | POA: Diagnosis not present

## 2018-12-21 DIAGNOSIS — Z30011 Encounter for initial prescription of contraceptive pills: Secondary | ICD-10-CM

## 2018-12-21 NOTE — Progress Notes (Addendum)
NEW GYN presents for AEX/PAP/STD Testing.  She needs Rx for OTC.  UPT Today is NEGATIVE.  GAD-7=6

## 2018-12-21 NOTE — Progress Notes (Signed)
GYNECOLOGY ANNUAL PREVENTATIVE CARE ENCOUNTER NOTE  History:     Jasmine Bowman is a 28 y.o. G70P1011 female here for a routine annual gynecologic exam.  Current complaints: none. Denies abnormal vaginal bleeding, discharge, pelvic pain, problems with intercourse or other gynecologic concerns.    Gynecologic History No LMP recorded (lmp unknown). (Menstrual status: Irregular Periods). Contraception: none. Is not sexually active with men Last Pap: 2019 at another office. Results were: normal with negative HPV Last mammogram: n/a    Obstetric History OB History  Gravida Para Term Preterm AB Living  2 1 1  0 1 1  SAB TAB Ectopic Multiple Live Births  1 0 0 0 1    # Outcome Date GA Lbr Len/2nd Weight Sex Delivery Anes PTL Lv  2 Term 11/10/10 [redacted]w[redacted]d 13:51 / 00:26 2785 g M Vag-Spont EPI  LIV  1 SAB             Past Medical History:  Diagnosis Date  . Abnormal Pap smear    normal repeats  . Anemia   . Asthma    Albuterol rescue inhaler  . Chlamydia    tx and resolved 2010  . Dyspnea   . Headache   . High cholesterol   . Vaginal Pap smear, abnormal     Past Surgical History:  Procedure Laterality Date  . NO PAST SURGERIES      Current Outpatient Medications on File Prior to Visit  Medication Sig Dispense Refill  . cyclobenzaprine (FLEXERIL) 5 MG tablet Take 1 tablet (5 mg total) by mouth every 8 (eight) hours as needed for muscle spasms. (Patient not taking: Reported on 12/21/2018) 30 tablet 1  . ibuprofen (ADVIL,MOTRIN) 800 MG tablet Take 800 mg by mouth 3 (three) times daily.  0  . methocarbamol (ROBAXIN) 750 MG tablet Take 750 mg by mouth 4 (four) times daily.  0  . nortriptyline (PAMELOR) 10 MG capsule Take 1 capsule (10 mg total) by mouth at bedtime. (Patient not taking: Reported on 12/21/2018) 30 capsule 3  . traMADol (ULTRAM) 50 MG tablet Take 50 mg by mouth as needed.  0   No current facility-administered medications on file prior to visit.     No Known  Allergies  Social History:  reports that she has never smoked. She has never used smokeless tobacco. She reports current alcohol use. She reports that she does not use drugs.  Family History  Problem Relation Age of Onset  . Hypertension Mother   . Breast cancer Maternal Grandmother   . Migraines Neg Hx     The following portions of the patient's history were reviewed and updated as appropriate: allergies, current medications, past family history, past medical history, past social history, past surgical history and problem list.  Review of Systems Pertinent items noted in HPI and remainder of comprehensive ROS otherwise negative.  Physical Exam:  BP 120/80   Pulse 77   Ht 5\' 6"  (1.676 m)   Wt 89.9 kg   LMP  (LMP Unknown)   BMI 32.01 kg/m  CONSTITUTIONAL: Well-developed, well-nourished female in no acute distress.  HENT:  Normocephalic, atraumatic, External right and left ear normal. Oropharynx is clear and moist EYES: Conjunctivae and EOM are normal. Pupils are equal, round, and reactive to light. No scleral icterus.  NECK: Normal range of motion, supple, no masses.  Normal thyroid.  SKIN: Skin is warm and dry. No rash noted. Not diaphoretic. No erythema. No pallor. MUSCULOSKELETAL: Normal range of motion. No  tenderness.  No cyanosis, clubbing, or edema.  2+ distal pulses. NEUROLOGIC: Alert and oriented to person, place, and time. Normal reflexes, muscle tone coordination. No cranial nerve deficit noted. PSYCHIATRIC: Normal mood and affect. Normal behavior. Normal judgment and thought content. CARDIOVASCULAR: Normal heart rate noted, regular rhythm RESPIRATORY: Clear to auscultation bilaterally. Effort and breath sounds normal, no problems with respiration noted. BREASTS: Symmetric in size. No masses, skin changes, nipple drainage, or lymphadenopathy. ABDOMEN: Soft, normal bowel sounds, no distention noted.  No tenderness, rebound or guarding.  PELVIC: Normal appearing external  genitalia;  vaginal mucosa pink with rugae. Cervix smooth, pink, without masses, nonfriable. No abnormal discharge noted.  Pap smear obtained.  Normal uterine size, no other palpable masses, no uterine or adnexal tenderness.   Assessment and Plan:    1. Women's annual routine gynecological examination -- Pt here for well-woman exam with Pap -- No complaints today   - Cytology - PAP( [facility])  2. Screening for STDs (sexually transmitted diseases) -- Pt is requesting STD testing   - HIV antibody (with reflex) - RPR  Will follow up results of pap smear and manage accordingly. Routine preventative health maintenance measures emphasized. Please refer to After Visit Summary for other counseling recommendations.    Follow up in 1 year for well-woman exam   Maryagnes Amos, SNM

## 2018-12-22 LAB — HIV ANTIBODY (ROUTINE TESTING W REFLEX): HIV Screen 4th Generation wRfx: NONREACTIVE

## 2018-12-22 LAB — RPR: RPR Ser Ql: NONREACTIVE

## 2018-12-26 ENCOUNTER — Telehealth: Payer: Self-pay

## 2018-12-26 NOTE — Telephone Encounter (Signed)
Pt states she discussed Birth Control pills at her Annual Visit. She went to the pharmacy to pick them up and they were not called in.  Can you advise?

## 2018-12-27 LAB — CYTOLOGY - PAP
Chlamydia: NEGATIVE
Comment: NEGATIVE
Comment: NORMAL
Diagnosis: NEGATIVE
Neisseria Gonorrhea: NEGATIVE

## 2018-12-29 ENCOUNTER — Ambulatory Visit (INDEPENDENT_AMBULATORY_CARE_PROVIDER_SITE_OTHER): Payer: Medicaid Other

## 2018-12-29 ENCOUNTER — Other Ambulatory Visit: Payer: Self-pay | Admitting: Certified Nurse Midwife

## 2018-12-29 ENCOUNTER — Other Ambulatory Visit: Payer: Self-pay

## 2018-12-29 DIAGNOSIS — Z30011 Encounter for initial prescription of contraceptive pills: Secondary | ICD-10-CM

## 2018-12-29 DIAGNOSIS — Z3202 Encounter for pregnancy test, result negative: Secondary | ICD-10-CM

## 2018-12-29 DIAGNOSIS — Z309 Encounter for contraceptive management, unspecified: Secondary | ICD-10-CM

## 2018-12-29 LAB — POCT URINE PREGNANCY: Preg Test, Ur: NEGATIVE

## 2018-12-29 MED ORDER — NORETHIN ACE-ETH ESTRAD-FE 1-20 MG-MCG(24) PO TABS: 1.0000 | ORAL_TABLET | Freq: Every day | ORAL | 11 refills | Status: AC

## 2018-12-29 NOTE — Progress Notes (Signed)
Patient was seen for annual on 11/11 and not prescribed OCPs that she desires. UPT negative today in the office. Initial prescription sent to pharmacy on file.  Lajean Manes, CNM 12/29/18, 1:28 PM

## 2018-12-29 NOTE — Progress Notes (Signed)
Pt presents for pregnancy test for birth control start. Test today is negative. Message sent to provider to send rx for ocp.

## 2019-04-13 ENCOUNTER — Ambulatory Visit: Payer: Medicaid Other | Admitting: Certified Nurse Midwife

## 2019-05-02 ENCOUNTER — Other Ambulatory Visit: Payer: Self-pay

## 2019-05-02 ENCOUNTER — Other Ambulatory Visit (HOSPITAL_COMMUNITY)
Admission: RE | Admit: 2019-05-02 | Discharge: 2019-05-02 | Disposition: A | Discharge status: Home / Self Care | Payer: Medicaid Other | Source: Ambulatory Visit | Attending: Certified Nurse Midwife | Admitting: Certified Nurse Midwife

## 2019-05-02 ENCOUNTER — Encounter: Payer: Self-pay | Admitting: Certified Nurse Midwife

## 2019-05-02 ENCOUNTER — Ambulatory Visit (INDEPENDENT_AMBULATORY_CARE_PROVIDER_SITE_OTHER): Payer: Medicaid Other | Admitting: Certified Nurse Midwife

## 2019-05-02 VITALS — BP 122/81 | HR 98 | Temp 97.7°F | Wt 198.8 lb

## 2019-05-02 DIAGNOSIS — Z113 Encounter for screening for infections with a predominantly sexual mode of transmission: Secondary | ICD-10-CM

## 2019-05-02 DIAGNOSIS — B373 Candidiasis of vulva and vagina: Secondary | ICD-10-CM | POA: Diagnosis not present

## 2019-05-02 DIAGNOSIS — B3731 Acute candidiasis of vulva and vagina: Secondary | ICD-10-CM

## 2019-05-02 NOTE — Progress Notes (Signed)
Pt presents for all STD testing and wet prep.   Denies any sx's Currently on ABx for UTI - sx's relieved  Normal pap 12/2018

## 2019-05-02 NOTE — Progress Notes (Signed)
History:  Ms. Jasmine Bowman is a 29 y.o. G2P1011 who presents to clinic today for STD screening. Patient reports having a UTI beginning Friday, is currently on antibiotics of treatment for UTI. Pt reports new partner that was questioning STD status. Patient request full STD screening but also yeast and BV screening d/t antibiotics.   She denies increase in vaginal discharge, odor or irritation at this time.   The following portions of the patient's history were reviewed and updated as appropriate: allergies, current medications, family history, past medical history, social history, past surgical history and problem list.  Review of Systems:  Review of Systems  Constitutional: Negative.   Respiratory: Negative.   Cardiovascular: Negative.   Gastrointestinal: Negative.   Genitourinary: Negative.   Neurological: Negative.      Objective:  Physical Exam BP 122/81   Pulse 98   Temp 97.7 F (36.5 C)   Wt 198 lb 12.8 oz (90.2 kg)   LMP 04/30/2019   BMI 32.09 kg/m  Physical Exam Vitals and nursing note reviewed.  HENT:     Head: Normocephalic.  Cardiovascular:     Rate and Rhythm: Normal rate and regular rhythm.  Pulmonary:     Effort: Pulmonary effort is normal. No respiratory distress.     Breath sounds: Normal breath sounds. No wheezing.  Abdominal:     General: There is no distension.     Palpations: Abdomen is soft.     Tenderness: There is no abdominal tenderness. There is no right CVA tenderness, left CVA tenderness or guarding.  Genitourinary:    Comments: Blind swabs obtained Neurological:     Mental Status: She is alert and oriented to person, place, and time.    Assessment & Plan:  1. Screening for STDs (sexually transmitted diseases) - Patient request STD screening - Cervicovaginal ancillary only( [place]) - Hepatitis B surface antigen - Hepatitis C antibody - HIV Antibody (routine testing w rflx) - RPR   Will follow up with results of testing and manage  accordingly  Educated and discussed mychart activation with patient - text message sent to phone for activation    Mcneil Sober 05/02/2019 9:29 AM

## 2019-05-02 NOTE — Patient Instructions (Signed)
Alternative Vaginitis Therapies  1) soak in tub of warm water waist high with 1/2 cup of baking soda in water for ~ 20 mins. 2) soak 3 tampons in 1 tablespoon of fractionated (liquid form) coconut oil with 10 drops of Melaleuca (Tea Tree) essential oil, insert 1 saturated tampon vaginally at bedtime x 3 days. Both options are to be done after sexual intercourse, menses and when suspects BV and/or yeast infection. Advised that these alternatives will not replace the need to be evaluated, if sx's persist. You will need to seek care at an OB/GYN provider.  GO WHITE: Soap: UNSCENTED Dove (white box light green writing) Laundry detergent (underwear)- Dreft or [employer] unscented WHITE 100% cotton panties (NOT just cotton crouch) Sanitary napkin/panty liners: UNSCENTED.  If it doesn't SAY unscented it can have a scent/perfume    NO PERFUMES OR LOTIONS OR POTIONS in the vulvar area (may use regular [place]) Condoms: hypoallergenic only. Non dyed (no color) Toilet papers: white only Wash clothes: use a separate wash cloth. WHITE.  Wash in Dreft.   You can purchase Tea Tree Oil locally at:  [employer] [address] [place], [place] [address] [phone]  [employer] [address] [place], [place] [address] [phone]  [facility] [address] [place],  [address] [phone] 

## 2019-05-03 LAB — CERVICOVAGINAL ANCILLARY ONLY
Bacterial Vaginitis (gardnerella): POSITIVE — AB
Candida Glabrata: NEGATIVE
Candida Vaginitis: POSITIVE — AB
Chlamydia: NEGATIVE
Comment: NEGATIVE
Comment: NEGATIVE
Comment: NEGATIVE
Comment: NEGATIVE
Comment: NEGATIVE
Comment: NORMAL
Neisseria Gonorrhea: NEGATIVE
Trichomonas: NEGATIVE

## 2019-05-03 LAB — HEPATITIS C ANTIBODY: Hep C Virus Ab: <0.1 s/co ratio (ref 0.0–0.9)

## 2019-05-03 LAB — HEPATITIS B SURFACE ANTIGEN: Hepatitis B Surface Ag: NEGATIVE

## 2019-05-03 LAB — RPR: RPR Ser Ql: NONREACTIVE

## 2019-05-03 LAB — HIV ANTIBODY (ROUTINE TESTING W REFLEX): HIV Screen 4th Generation wRfx: NONREACTIVE

## 2019-05-03 MED ORDER — FLUCONAZOLE 150 MG PO TABS: 150.0000 mg | ORAL_TABLET | Freq: Every day | ORAL | 1 refills | Status: AC

## 2019-05-03 NOTE — Addendum Note (Signed)
Addended by: Sharyon Cable on: 05/03/2019 04:58 PM   Modules accepted: Orders

## 2019-09-08 DIAGNOSIS — Z20822 Contact with and (suspected) exposure to covid-19: Secondary | ICD-10-CM | POA: Diagnosis not present

## 2020-02-18 DIAGNOSIS — Z1152 Encounter for screening for COVID-19: Secondary | ICD-10-CM | POA: Diagnosis not present

## 2020-02-23 DIAGNOSIS — Z1152 Encounter for screening for COVID-19: Secondary | ICD-10-CM | POA: Diagnosis not present

## 2020-05-10 DIAGNOSIS — Z419 Encounter for procedure for purposes other than remedying health state, unspecified: Secondary | ICD-10-CM | POA: Diagnosis not present

## 2020-06-09 DIAGNOSIS — Z419 Encounter for procedure for purposes other than remedying health state, unspecified: Secondary | ICD-10-CM | POA: Diagnosis not present

## 2020-07-10 DIAGNOSIS — Z419 Encounter for procedure for purposes other than remedying health state, unspecified: Secondary | ICD-10-CM | POA: Diagnosis not present

## 2020-08-09 DIAGNOSIS — Z419 Encounter for procedure for purposes other than remedying health state, unspecified: Secondary | ICD-10-CM | POA: Diagnosis not present

## 2020-09-09 DIAGNOSIS — Z419 Encounter for procedure for purposes other than remedying health state, unspecified: Secondary | ICD-10-CM | POA: Diagnosis not present

## 2020-10-10 DIAGNOSIS — Z419 Encounter for procedure for purposes other than remedying health state, unspecified: Secondary | ICD-10-CM | POA: Diagnosis not present

## 2020-11-05 ENCOUNTER — Other Ambulatory Visit: Payer: Self-pay

## 2020-11-05 ENCOUNTER — Ambulatory Visit (INDEPENDENT_AMBULATORY_CARE_PROVIDER_SITE_OTHER): Payer: Medicaid Other | Admitting: Advanced Practice Midwife

## 2020-11-05 ENCOUNTER — Encounter: Payer: Self-pay | Admitting: Advanced Practice Midwife

## 2020-11-05 ENCOUNTER — Other Ambulatory Visit (HOSPITAL_COMMUNITY)
Admission: RE | Admit: 2020-11-05 | Discharge: 2020-11-05 | Disposition: A | Discharge status: Home / Self Care | Payer: Medicaid Other | Source: Ambulatory Visit | Attending: Advanced Practice Midwife | Admitting: Advanced Practice Midwife

## 2020-11-05 VITALS — BP 116/83 | HR 57 | Ht 65.0 in | Wt 205.0 lb

## 2020-11-05 DIAGNOSIS — Z124 Encounter for screening for malignant neoplasm of cervix: Secondary | ICD-10-CM | POA: Insufficient documentation

## 2020-11-05 DIAGNOSIS — Z113 Encounter for screening for infections with a predominantly sexual mode of transmission: Secondary | ICD-10-CM

## 2020-11-05 DIAGNOSIS — Z01419 Encounter for gynecological examination (general) (routine) without abnormal findings: Secondary | ICD-10-CM

## 2020-11-05 NOTE — Progress Notes (Signed)
Patient here for Annual Exam.  LMP:  10/24/20  lasting 3-4 days with moderate flow. cycles are monthly per pt  *pt uses Yoni soap bars.   Last pap: 12/21/2018  STD Screening: Desires Full Panel also wants to screen for BV and yeast made aware may not cover at 100%.  Family Hx of Breast Cancer: MGM .and Maternal Aunt.   CC: None

## 2020-11-05 NOTE — Progress Notes (Signed)
   Subjective:     Jasmine Bowman is a 30 y.o. female here at [facility] for a routine exam.  Current complaints: none.  Personal health questionnaire reviewed: yes.  Do you have a primary care provider? yes Do you feel safe at home? 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   Gynecologic History Patient's last menstrual period was 10/24/2020. Contraception:  same sex relationship Last Pap: 2020. Results were: normal Last mammogram: n/a.  Obstetric History OB History  Gravida Para Term Preterm AB Living  2 1 1  0 1 1  SAB IAB Ectopic Multiple Live Births  1 0 0 0 1    # Outcome Date GA Lbr Len/2nd Weight Sex Delivery Anes PTL Lv  2 Term 11/10/10 [redacted]w[redacted]d 13:51 / 00:26 6 lb 2.2 oz (2.785 kg) M Vag-Spont EPI  LIV  1 SAB              The following portions of the patient's history were reviewed and updated as appropriate: allergies, current medications, past family history, past medical history, past social history, past surgical history, and problem list.  Review of Systems Pertinent items noted in HPI and remainder of comprehensive ROS otherwise negative.    Objective:   BP 116/83   Pulse (!) 57   Ht 5\' 5"  (1.651 m)   Wt 205 lb (93 kg)   LMP 10/24/2020   BMI 34.11 kg/m  VS reviewed, nursing note reviewed,  Constitutional: well developed, well nourished, no distress HEENT: normocephalic CV: normal rate Pulm/chest wall: normal effort Breast Exam:  exam performed: right breast normal without mass, skin or nipple changes or axillary nodes, left breast normal without mass, skin or nipple changes or axillary nodes Abdomen: soft Neuro: alert and oriented x 3 Skin: warm, dry Psych: affect normal Pelvic exam: Performed:  Cervix pink, visually closed, without lesion, scant white creamy discharge, vaginal walls and external genitalia normal Bimanual exam: Cervix 0/long/high, firm, anterior, neg CMT, uterus nontender, nonenlarged, adnexa without tenderness, enlargement, or mass       Assessment/Plan:   1. Well woman exam with routine gynecological exam   2. Screen for STD (sexually transmitted disease)  - Cervicovaginal ancillary only( [place]) - Hepatitis B surface antigen - Hepatitis C antibody - HIV Antibody (routine testing w rflx) - RPR  3. Cervical cancer screening --Pt with last Pap 2020 wnl. Discussed recommendations with pt, next pap due in 2021. Since pt is now 29, desires to do Pap today and follow guidelines for 3-5 years from now if normal results.  - Cytology - PAP      Follow up in: 1  year  or as needed.   10/26/2020, CNM 1:19 PM

## 2020-11-06 LAB — HEPATITIS C ANTIBODY: Hep C Virus Ab: <0.1 s/co ratio (ref 0.0–0.9)

## 2020-11-06 LAB — CERVICOVAGINAL ANCILLARY ONLY
Chlamydia: NEGATIVE
Comment: NEGATIVE
Comment: NEGATIVE
Comment: NORMAL
Neisseria Gonorrhea: NEGATIVE
Trichomonas: NEGATIVE

## 2020-11-06 LAB — HIV ANTIBODY (ROUTINE TESTING W REFLEX): HIV Screen 4th Generation wRfx: NONREACTIVE

## 2020-11-06 LAB — HEPATITIS B SURFACE ANTIGEN: Hepatitis B Surface Ag: NEGATIVE

## 2020-11-06 LAB — RPR: RPR Ser Ql: NONREACTIVE

## 2020-11-08 LAB — CYTOLOGY - PAP
Comment: NEGATIVE
Diagnosis: NEGATIVE
High risk HPV: NEGATIVE

## 2020-11-09 DIAGNOSIS — Z419 Encounter for procedure for purposes other than remedying health state, unspecified: Secondary | ICD-10-CM | POA: Diagnosis not present

## 2020-12-10 DIAGNOSIS — Z419 Encounter for procedure for purposes other than remedying health state, unspecified: Secondary | ICD-10-CM | POA: Diagnosis not present

## 2021-01-09 DIAGNOSIS — Z419 Encounter for procedure for purposes other than remedying health state, unspecified: Secondary | ICD-10-CM | POA: Diagnosis not present

## 2021-01-21 ENCOUNTER — Telehealth: Payer: Self-pay | Admitting: Genetic Counselor

## 2021-01-21 NOTE — Telephone Encounter (Signed)
Scheduled appt per 12/12 referral. Pt is aware of appt date and time.  

## 2021-02-09 DIAGNOSIS — Z419 Encounter for procedure for purposes other than remedying health state, unspecified: Secondary | ICD-10-CM | POA: Diagnosis not present

## 2021-02-18 ENCOUNTER — Inpatient Hospital Stay: Payer: Medicaid Other

## 2021-02-18 ENCOUNTER — Inpatient Hospital Stay: Payer: Medicaid Other | Admitting: Genetic Counselor

## 2021-03-12 DIAGNOSIS — Z419 Encounter for procedure for purposes other than remedying health state, unspecified: Secondary | ICD-10-CM | POA: Diagnosis not present

## 2021-04-09 DIAGNOSIS — Z419 Encounter for procedure for purposes other than remedying health state, unspecified: Secondary | ICD-10-CM | POA: Diagnosis not present

## 2021-05-10 DIAGNOSIS — Z419 Encounter for procedure for purposes other than remedying health state, unspecified: Secondary | ICD-10-CM | POA: Diagnosis not present

## 2021-06-09 DIAGNOSIS — Z419 Encounter for procedure for purposes other than remedying health state, unspecified: Secondary | ICD-10-CM | POA: Diagnosis not present

## 2021-07-10 DIAGNOSIS — Z419 Encounter for procedure for purposes other than remedying health state, unspecified: Secondary | ICD-10-CM | POA: Diagnosis not present

## 2021-08-09 DIAGNOSIS — Z419 Encounter for procedure for purposes other than remedying health state, unspecified: Secondary | ICD-10-CM | POA: Diagnosis not present

## 2021-09-09 DIAGNOSIS — Z419 Encounter for procedure for purposes other than remedying health state, unspecified: Secondary | ICD-10-CM | POA: Diagnosis not present

## 2021-10-10 DIAGNOSIS — Z419 Encounter for procedure for purposes other than remedying health state, unspecified: Secondary | ICD-10-CM | POA: Diagnosis not present

## 2021-11-09 DIAGNOSIS — Z419 Encounter for procedure for purposes other than remedying health state, unspecified: Secondary | ICD-10-CM | POA: Diagnosis not present

## 2021-12-10 DIAGNOSIS — Z419 Encounter for procedure for purposes other than remedying health state, unspecified: Secondary | ICD-10-CM | POA: Diagnosis not present

## 2022-01-09 DIAGNOSIS — Z419 Encounter for procedure for purposes other than remedying health state, unspecified: Secondary | ICD-10-CM | POA: Diagnosis not present

## 2022-02-09 DIAGNOSIS — Z419 Encounter for procedure for purposes other than remedying health state, unspecified: Secondary | ICD-10-CM | POA: Diagnosis not present

## 2022-03-12 DIAGNOSIS — Z419 Encounter for procedure for purposes other than remedying health state, unspecified: Secondary | ICD-10-CM | POA: Diagnosis not present

## 2022-04-10 DIAGNOSIS — Z419 Encounter for procedure for purposes other than remedying health state, unspecified: Secondary | ICD-10-CM | POA: Diagnosis not present

## 2022-05-11 DIAGNOSIS — Z419 Encounter for procedure for purposes other than remedying health state, unspecified: Secondary | ICD-10-CM | POA: Diagnosis not present

## 2022-06-10 DIAGNOSIS — Z419 Encounter for procedure for purposes other than remedying health state, unspecified: Secondary | ICD-10-CM | POA: Diagnosis not present

## 2022-07-11 DIAGNOSIS — Z419 Encounter for procedure for purposes other than remedying health state, unspecified: Secondary | ICD-10-CM | POA: Diagnosis not present

## 2022-08-10 DIAGNOSIS — Z419 Encounter for procedure for purposes other than remedying health state, unspecified: Secondary | ICD-10-CM | POA: Diagnosis not present

## 2022-09-10 DIAGNOSIS — Z419 Encounter for procedure for purposes other than remedying health state, unspecified: Secondary | ICD-10-CM | POA: Diagnosis not present

## 2022-10-11 DIAGNOSIS — Z419 Encounter for procedure for purposes other than remedying health state, unspecified: Secondary | ICD-10-CM | POA: Diagnosis not present

## 2022-11-10 DIAGNOSIS — Z419 Encounter for procedure for purposes other than remedying health state, unspecified: Secondary | ICD-10-CM | POA: Diagnosis not present

## 2022-12-11 DIAGNOSIS — Z419 Encounter for procedure for purposes other than remedying health state, unspecified: Secondary | ICD-10-CM | POA: Diagnosis not present

## 2023-01-10 DIAGNOSIS — Z419 Encounter for procedure for purposes other than remedying health state, unspecified: Secondary | ICD-10-CM | POA: Diagnosis not present

## 2023-02-10 DIAGNOSIS — Z419 Encounter for procedure for purposes other than remedying health state, unspecified: Secondary | ICD-10-CM | POA: Diagnosis not present

## 2023-03-13 DIAGNOSIS — Z419 Encounter for procedure for purposes other than remedying health state, unspecified: Secondary | ICD-10-CM | POA: Diagnosis not present

## 2023-04-10 DIAGNOSIS — Z419 Encounter for procedure for purposes other than remedying health state, unspecified: Secondary | ICD-10-CM | POA: Diagnosis not present

## 2023-04-21 DIAGNOSIS — Z419 Encounter for procedure for purposes other than remedying health state, unspecified: Secondary | ICD-10-CM | POA: Diagnosis not present

## 2023-05-22 DIAGNOSIS — Z419 Encounter for procedure for purposes other than remedying health state, unspecified: Secondary | ICD-10-CM | POA: Diagnosis not present

## 2023-06-21 DIAGNOSIS — Z419 Encounter for procedure for purposes other than remedying health state, unspecified: Secondary | ICD-10-CM | POA: Diagnosis not present

## 2023-07-22 DIAGNOSIS — Z419 Encounter for procedure for purposes other than remedying health state, unspecified: Secondary | ICD-10-CM | POA: Diagnosis not present

## 2023-08-21 DIAGNOSIS — Z419 Encounter for procedure for purposes other than remedying health state, unspecified: Secondary | ICD-10-CM | POA: Diagnosis not present

## 2023-09-21 DIAGNOSIS — Z419 Encounter for procedure for purposes other than remedying health state, unspecified: Secondary | ICD-10-CM | POA: Diagnosis not present

## 2023-10-22 DIAGNOSIS — Z419 Encounter for procedure for purposes other than remedying health state, unspecified: Secondary | ICD-10-CM | POA: Diagnosis not present

## 2024-01-12 ENCOUNTER — Other Ambulatory Visit: Payer: Self-pay | Admitting: Medical Genetics
